# Patient Record
Sex: Female | Born: 1980 | Race: White | Hispanic: No | Marital: Married | Smoking: Current every day smoker
Health system: Southern US, Community
[De-identification: ages and names within clinical notes are randomized; demographics above are authoritative.]

## PROBLEM LIST (undated history)

## (undated) DIAGNOSIS — N200 Calculus of kidney: Secondary | ICD-10-CM

## (undated) DIAGNOSIS — B353 Tinea pedis: Secondary | ICD-10-CM

## (undated) DIAGNOSIS — G932 Benign intracranial hypertension: Secondary | ICD-10-CM

## (undated) DIAGNOSIS — G43909 Migraine, unspecified, not intractable, without status migrainosus: Secondary | ICD-10-CM

## (undated) DIAGNOSIS — E559 Vitamin D deficiency, unspecified: Secondary | ICD-10-CM

## (undated) DIAGNOSIS — G4730 Sleep apnea, unspecified: Secondary | ICD-10-CM

## (undated) HISTORY — DX: Migraine, unspecified, not intractable, without status migrainosus: G43.909

## (undated) HISTORY — DX: Vitamin D deficiency, unspecified: E55.9

## (undated) HISTORY — DX: Benign intracranial hypertension: G93.2

## (undated) HISTORY — DX: Sleep apnea, unspecified: G47.30

## (undated) HISTORY — DX: Calculus of kidney: N20.0

## (undated) HISTORY — PX: LUMBAR PUNCTURE: SHX1985

## (undated) HISTORY — DX: Tinea pedis: B35.3

## (undated) HISTORY — PX: LITHOTRIPSY: SUR834

---
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
## 2011-01-29 ENCOUNTER — Other Ambulatory Visit: Payer: Self-pay | Admitting: Neurology

## 2011-01-29 ENCOUNTER — Other Ambulatory Visit (HOSPITAL_COMMUNITY): Payer: Self-pay | Admitting: Neurology

## 2011-01-29 DIAGNOSIS — G932 Benign intracranial hypertension: Secondary | ICD-10-CM

## 2011-01-30 ENCOUNTER — Ambulatory Visit
Admission: RE | Admit: 2011-01-30 | Discharge: 2011-01-30 | Disposition: A | Discharge status: Home / Self Care | Payer: Medicaid Other | Source: Ambulatory Visit | Attending: Neurology | Admitting: Neurology

## 2011-01-30 ENCOUNTER — Other Ambulatory Visit: Payer: Self-pay | Admitting: Neurology

## 2011-01-30 ENCOUNTER — Inpatient Hospital Stay
Admission: RE | Admit: 2011-01-30 | Discharge: 2011-01-30 | Payer: Self-pay | Source: Ambulatory Visit | Attending: Neurology | Admitting: Neurology

## 2011-01-30 VITALS — BP 115/68 | HR 63

## 2011-01-30 DIAGNOSIS — R52 Pain, unspecified: Secondary | ICD-10-CM

## 2011-01-30 NOTE — Progress Notes (Signed)
Note given to excuse patient from school tomorrow.  jkl

## 2011-01-30 NOTE — Progress Notes (Signed)
Discharge instructions explained to pt and family.

## 2011-02-06 ENCOUNTER — Ambulatory Visit
Admission: RE | Admit: 2011-02-06 | Discharge: 2011-02-06 | Disposition: A | Discharge status: Home / Self Care | Payer: Medicaid Other | Source: Ambulatory Visit | Attending: Neurology | Admitting: Neurology

## 2011-02-06 ENCOUNTER — Other Ambulatory Visit: Payer: Self-pay | Admitting: Neurology

## 2011-02-06 DIAGNOSIS — G971 Other reaction to spinal and lumbar puncture: Secondary | ICD-10-CM

## 2011-02-06 MED ORDER — IOHEXOL 180 MG/ML  SOLN
1.0000 mL | Freq: Once | INTRAMUSCULAR | Status: AC | PRN
  Administered 2011-02-06: 1 mL via EPIDURAL

## 2011-02-06 NOTE — Progress Notes (Signed)
Dr. Carlota Raspberry in to speak with patient and her mom about blood patch and patient's symptoms.  jkl

## 2011-02-06 NOTE — Progress Notes (Signed)
20cc blood drawn from left AC space without difficulty for procedure; site unremarkable.  jkl 

## 2011-02-06 NOTE — Progress Notes (Signed)
Ice pack to lower back for c/o discomfort, pressure.  Mom at bedside.  jkl

## 2020-01-26 ENCOUNTER — Other Ambulatory Visit: Payer: Self-pay

## 2020-01-26 ENCOUNTER — Ambulatory Visit: Payer: BLUE CROSS/BLUE SHIELD | Admitting: Neurology

## 2020-01-26 ENCOUNTER — Encounter: Payer: Self-pay | Admitting: Neurology

## 2020-01-26 VITALS — BP 112/72 | HR 59 | Ht 65.0 in | Wt 268.0 lb

## 2020-01-26 DIAGNOSIS — G43719 Chronic migraine without aura, intractable, without status migrainosus: Secondary | ICD-10-CM | POA: Insufficient documentation

## 2020-01-26 DIAGNOSIS — G932 Benign intracranial hypertension: Secondary | ICD-10-CM | POA: Diagnosis not present

## 2020-01-26 MED ORDER — SUMATRIPTAN SUCCINATE 100 MG PO TABS: 100.0000 mg | ORAL_TABLET | Freq: Once | ORAL | 11 refills | Status: AC | PRN

## 2020-01-26 MED ORDER — VENLAFAXINE HCL ER 75 MG PO CP24: 75.0000 mg | ORAL_CAPSULE | Freq: Every day | ORAL | 6 refills | Status: AC

## 2020-01-26 NOTE — Progress Notes (Signed)
Chief Complaint  Patient presents with  . New Patient (Initial Visit)    Referred for psuedotumor cerebri. She is here with her mother, Natalie Randolph. She is currently on topiramate 50mg , one tablet at bedtime. She is still having frequent headaches. She was previously taking a higher dose that helped more. Her last LP was around 5 years ago. She has history of kidney stone. She has also taken Diamox in the past. She was pregnant at the time and delivered her baby stillborn at 30 weeks. States she will never take the medication again.     HISTORICAL  Natalie Randolph is a 40 year old female, accompanied by her mother, seen in request by for primary care doctor 20 for evaluation of frequent headaches, history of pseudotumor cerebri, initial evaluation was on January 25, 2010.  I reviewed and summarized the referring note.  Past medical history Morbid obesity History of pseudotumor cerebri, diagnosis was made in 2012, she presented with frequent headaches, blurry vision, light sensitivity, tinnitus, Per record, lumbar puncture in 2013 with opening pressure of 32 cmH2O, she has to require blood patch afterwards  She was treated with acetazolamide initially, but still require lumbar puncture about every 6 months, per patient, each time the opening pressure was elevated, and she has to receive blood patch afterwards because intractable positional related headache  Most recent lumbar puncture was at 2014 in 2016, following that, she was pregnant while taking Diamox 500 mg 3 times daily, she had a stillborn birth at 34 weeks, she was setting to severe depression," I do not care about it anymore", was put on Topamax 50 mg every night, also Lasix 80 mg 3 times a day, lost regular follow-up.  Over the years, she continue has daily headaches, since 2020, she began to take frequent Excedrin Migraine, 2 to 3 tablets a day, sometimes mixed with ibuprofen, she described 6 out of 10 constant throbbing  headaches, 3-4 times each week, it would exacerbated to 10 out of 10 headache, she does not want to do anything then, has to cover herself up, take medication, and go to sleep.  She also reported constant steady weight gain, now 268 pounds, has not been compliant with her Topamax, and Lasix, she also reported a history of kidney stone in 2017 require lithotripsy treatment  She is scheduled to see optometrist at the end of January 2022, she noticed return of symptoms, constant whooshing sound in her ear, gradually worsening headaches, light sensitivity, mild blurry vision   REVIEW OF SYSTEMS: Full 14 system review of systems performed and notable only for as above All other review of systems were negative.  ALLERGIES: Allergies  Allergen Reactions  . Methylphenidate Hives    HOME MEDICATIONS: Current Outpatient Medications  Medication Sig Dispense Refill  . aspirin-acetaminophen-caffeine (EXCEDRIN MIGRAINE) 250-250-65 MG tablet Take 1 tablet by mouth as needed for headache.    . furosemide (LASIX) 80 MG tablet Take 80 mg by mouth 3 (three) times daily.    07-18-1985 ibuprofen (ADVIL) 200 MG tablet Take 200 mg by mouth as needed.    Marland Kitchen levonorgestrel (MIRENA) 20 MCG/24HR IUD 1 each by Intrauterine route once.    . topiramate (TOPAMAX) 50 MG tablet Take 50 mg by mouth daily.     No current facility-administered medications for this visit.    PAST MEDICAL HISTORY: Past Medical History:  Diagnosis Date  . Kidney stone   . Migraine   . Pseudotumor cerebri   . Sleep apnea  not wearing CPAP - could not tolerate  . Tinea pedis   . Vitamin D deficiency     PAST SURGICAL HISTORY: Past Surgical History:  Procedure Laterality Date  . LITHOTRIPSY    . LUMBAR PUNCTURE      FAMILY HISTORY: Family History  Problem Relation Age of Onset  . Hypertension Mother   . Transient ischemic attack Mother   . Breast cancer Father   . Lymphoma Father   . Healthy Sister   . Diabetes Paternal  Grandmother   . Diabetes Paternal Grandfather     SOCIAL HISTORY: Social History   Socioeconomic History  . Marital status: Married    Spouse name: Not on file  . Number of children: 1  . Years of education: GED  . Highest education level: Not on file  Occupational History  . 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 Smoking status: Current Every Day Smoker    Types: Cigarettes  . Smokeless tobacco: Never Used  . Tobacco comment: 1-2 packs every two weeks  Substance and Sexual Activity  . Alcohol use: Yes    Comment: few drinks per month  . Drug use: Never  . Sexual activity: Not on file  Other Topics Concern  . Not on file  Social History Narrative   Lives with family.   Right-handed.   Caffeine use: 2-3 cups tea per day, occasional coffee or soda.   Social Determinants of Health   Financial Resource Strain: Not on file  Food Insecurity: Not on file  Transportation Needs: Not on file  Physical Activity: Not on file  Stress: Not on file  Social Connections: Not on file  Intimate Partner Violence: Not on file     PHYSICAL EXAM   Vitals:   01/26/20 0903  BP: 112/72  Pulse: (!) 59  Weight: 268 lb (121.6 kg)  Height: 5\' 5"  (1.651 m)   Not recorded     Body mass index is 44.6 kg/m.  PHYSICAL EXAMNIATION:  Gen: NAD, conversant, well nourised, well groomed, obese     ,                Cardiovascular: Regular rate rhythm, no peripheral edema, warm, nontender. Eyes: Conjunctivae clear without exudates or hemorrhage Neck: Supple, no carotid bruits. Pulmonary: Clear to auscultation bilaterally   NEUROLOGICAL EXAM:  MENTAL STATUS: Speech:    Speech is normal; fluent and spontaneous with normal comprehension.  Cognition:     Orientation to time, place and person     Normal recent and remote memory     Normal Attention span and concentration     Normal Language, naming, repeating,spontaneous speech     Fund of knowledge    CRANIAL NERVES: CN II: Visual fields are full to confrontation. Pupils are round equal and briskly reactive to light. CN III, IV, VI: extraocular movement are normal. No ptosis. CN V: Facial sensation is intact to light touch CN VII: Face is symmetric with normal eye closure  CN VIII: Hearing is normal to causal conversation. CN IX, X: Phonation is normal. CN XI: Head turning and shoulder shrug are intact  MOTOR: There is no pronator drift of out-stretched arms. Muscle bulk and tone are normal. Muscle strength is normal.  REFLEXES: Reflexes are 2+ and symmetric at the biceps, triceps, knees, and ankles. Plantar responses are flexor.  SENSORY: Intact to light touch, pinprick and vibratory sensation are intact in fingers and toes.  COORDINATION: There is no  trunk or limb dysmetria noted.  GAIT/STANCE: Posture is normal. Gait is steady with normal steps, base, arm swing, and turning. Heel and toe walking are normal. Tandem gait is normal.  Romberg is absent.   DIAGNOSTIC DATA (LABS, IMAGING, TESTING) - I reviewed patient records, labs, notes, testing and imaging myself where available.   ASSESSMENT AND PLAN  Natalie Randolph is a 40 y.o. female   History of pseudotumor cerebri Frequent headaches with migraine features,  Also a component of medication rebound headaches  MRI of the brain  Advised her to stop daily Excedrin Migraine, ibuprofen use  Stop Topamax because history of kidney stone  Tapering down Lasix dose to 40 mg daily, may consider stop Lasix depending on ophthalmology findings  If the MRI of the brain, showed no structural abnormality, continued evidence of papillary edema, may consider lumbar puncture  Effexor xr75 mg daily as migraine prevention  Imitrex 100 mg as needed for abortive treatment   Levert Feinstein, M.D. Ph.D.  [facility] [address] [place], [place] [address] Ph: [phone] Fax: [phone]  CC:  Ignatius Specking,  MD [address] [place],  [place] [address]

## 2020-01-28 ENCOUNTER — Telehealth: Payer: Self-pay | Admitting: *Deleted

## 2020-01-28 NOTE — Telephone Encounter (Addendum)
PA for sumatriptan 100mg  tablets, #12/30 started on [email or web address] (key: [id number]). Pt has pharmacy coverage through [employer] (ID: ).  PA: [id number]. Case denied for this amount. Her plan will only allow #9/30. I called [facility] in [place] and spoke to [phone]. They will prepare the prescription for #9 (paid claim - rx will have $13 co-pay).

## 2020-02-01 ENCOUNTER — Telehealth: Payer: Self-pay | Admitting: Neurology

## 2020-02-01 NOTE — Telephone Encounter (Signed)
[facility] Numbers: [id number] (exp. 01/30/20 to 02/28/20) order sent to GI. They will reach out to the patient to schedule.

## 2020-02-27 ENCOUNTER — Ambulatory Visit
Admission: RE | Admit: 2020-02-27 | Discharge: 2020-02-27 | Disposition: A | Discharge status: Home / Self Care | Payer: BC Managed Care – PPO | Source: Ambulatory Visit | Attending: Neurology | Admitting: Neurology

## 2020-02-27 DIAGNOSIS — G932 Benign intracranial hypertension: Secondary | ICD-10-CM

## 2020-05-03 ENCOUNTER — Ambulatory Visit: Payer: BLUE CROSS/BLUE SHIELD | Admitting: Neurology
